# Patient Record
Sex: Male | Born: 1960 | Race: White | Hispanic: No | Marital: Married | Smoking: Current every day smoker
Health system: Southern US, Community
[De-identification: ages and names within clinical notes are randomized; demographics above are authoritative.]

## PROBLEM LIST (undated history)

## (undated) DIAGNOSIS — I10 Essential (primary) hypertension: Secondary | ICD-10-CM

## (undated) DIAGNOSIS — M4800 Spinal stenosis, site unspecified: Secondary | ICD-10-CM

## (undated) DIAGNOSIS — M25561 Pain in right knee: Secondary | ICD-10-CM

## (undated) DIAGNOSIS — G4733 Obstructive sleep apnea (adult) (pediatric): Secondary | ICD-10-CM

## (undated) DIAGNOSIS — B009 Herpesviral infection, unspecified: Secondary | ICD-10-CM

## (undated) DIAGNOSIS — M25562 Pain in left knee: Secondary | ICD-10-CM

## (undated) DIAGNOSIS — Z9989 Dependence on other enabling machines and devices: Secondary | ICD-10-CM

## (undated) HISTORY — PX: KNEE SURGERY: SHX244

## (undated) HISTORY — DX: Essential (primary) hypertension: I10

## (undated) HISTORY — PX: HERNIA REPAIR: SHX51

## (undated) HISTORY — DX: Pain in right knee: M25.562

## (undated) HISTORY — DX: Herpesviral infection, unspecified: B00.9

## (undated) HISTORY — PX: TONSILECTOMY, ADENOIDECTOMY, BILATERAL MYRINGOTOMY AND TUBES: SHX2538

## (undated) HISTORY — DX: Pain in left knee: M25.561

## (undated) HISTORY — PX: INGUINAL HERNIA REPAIR: SUR1180

## (undated) HISTORY — DX: Dependence on other enabling machines and devices: Z99.89

## (undated) HISTORY — DX: Obstructive sleep apnea (adult) (pediatric): G47.33

## (undated) HISTORY — DX: Spinal stenosis, site unspecified: M48.00

---

## 2011-06-29 ENCOUNTER — Emergency Department
Admit: 2011-06-29 | Discharge: 2011-06-29 | Disposition: A | Discharge status: Home / Self Care | Payer: Self-pay | Source: Emergency Department | Admitting: Emergency Medicine

## 2011-06-29 LAB — CBC AND DIFFERENTIAL
Basophils Absolute Automated: 0.01 10*3/uL (ref 0.00–0.20)
Basophils Automated: 0 % (ref 0–2)
Eosinophils Absolute Automated: 0.21 10*3/uL (ref 0.00–0.70)
Eosinophils Automated: 6 % — ABNORMAL HIGH (ref 0–5)
Hematocrit: 42.3 % (ref 42.0–52.0)
Hgb: 14.8 g/dL (ref 13.0–17.0)
Immature Granulocytes Absolute: 0 10*3/uL
Immature Granulocytes: 0 % (ref 0–1)
Lymphocytes Absolute Automated: 0.63 10*3/uL (ref 0.50–4.40)
Lymphocytes Automated: 19 % (ref 15–41)
MCH: 30.3 pg (ref 28.0–32.0)
MCHC: 35 g/dL (ref 32.0–36.0)
MCV: 86.5 fL (ref 80.0–100.0)
MPV: 10.8 fL (ref 9.4–12.3)
Monocytes Absolute Automated: 0.29 10*3/uL (ref 0.00–1.20)
Monocytes: 9 % (ref 0–11)
Neutrophils Absolute: 2.13 10*3/uL (ref 1.80–8.10)
Neutrophils: 65 % (ref 52–75)
Nucleated RBC: 0 /100 WBC
Platelets: 118 10*3/uL — ABNORMAL LOW (ref 140–400)
RBC: 4.89 10*6/uL (ref 4.70–6.00)
RDW: 13 % (ref 12–15)
WBC: 3.27 10*3/uL — ABNORMAL LOW (ref 3.50–10.80)

## 2011-06-29 LAB — URINALYSIS, REFLEX TO MICROSCOPIC EXAM IF INDICATED
Glucose, UA: NEGATIVE
Ketones UA: NEGATIVE
Leukocyte Esterase, UA: NEGATIVE
Nitrite, UA: NEGATIVE
Protein, UR: >=500 — AB
Specific Gravity UA POCT: 1.02 (ref 1.001–1.035)
Urine pH: 6 (ref 5.0–8.0)
Urobilinogen, UA: 2 mg/dL

## 2011-06-29 LAB — URINE ICTOTEST: Urine Ictotest: NEGATIVE

## 2018-08-20 DIAGNOSIS — R05 Cough: Secondary | ICD-10-CM | POA: Diagnosis not present

## 2018-08-20 DIAGNOSIS — J019 Acute sinusitis, unspecified: Secondary | ICD-10-CM | POA: Diagnosis not present

## 2018-08-20 DIAGNOSIS — R5381 Other malaise: Secondary | ICD-10-CM | POA: Diagnosis not present

## 2018-08-27 DIAGNOSIS — J40 Bronchitis, not specified as acute or chronic: Secondary | ICD-10-CM | POA: Diagnosis not present

## 2018-08-27 DIAGNOSIS — J329 Chronic sinusitis, unspecified: Secondary | ICD-10-CM | POA: Diagnosis not present

## 2018-09-15 DIAGNOSIS — M48061 Spinal stenosis, lumbar region without neurogenic claudication: Secondary | ICD-10-CM | POA: Diagnosis not present

## 2018-09-15 DIAGNOSIS — R030 Elevated blood-pressure reading, without diagnosis of hypertension: Secondary | ICD-10-CM | POA: Diagnosis not present

## 2018-09-15 DIAGNOSIS — J069 Acute upper respiratory infection, unspecified: Secondary | ICD-10-CM | POA: Diagnosis not present

## 2018-09-27 ENCOUNTER — Encounter: Payer: Self-pay | Admitting: Neurology

## 2018-09-28 ENCOUNTER — Ambulatory Visit: Payer: Commercial Managed Care - PPO | Admitting: Neurology

## 2018-09-28 ENCOUNTER — Encounter: Payer: Self-pay | Admitting: Neurology

## 2018-09-28 VITALS — BP 150/90 | HR 68 | Ht 71.0 in | Wt 274.0 lb

## 2018-09-28 DIAGNOSIS — Z9989 Dependence on other enabling machines and devices: Secondary | ICD-10-CM

## 2018-09-28 DIAGNOSIS — G4733 Obstructive sleep apnea (adult) (pediatric): Secondary | ICD-10-CM

## 2018-09-28 NOTE — Progress Notes (Signed)
Subjective:    Patient ID: Barry Holmes is a 58 y.o. male.  HPI     Star Age, MD, PhD [facility] [address], [address]O. [place], [place] [address]  Dear Dr. Brigitte Pulse,   I saw your patient, Barry Holmes, upon your kind request in my sleep clinic today for initial consultation of his prior diagnosis of obstructive sleep apnea. The patient is unaccompanied today. As you know, Barry Holmes is a 58 year old right-handed gentleman with an underlying medical history of lumbar spinal stenosis, bilateral knee pain with s/p right knee arthroscopic surgery in 2016, recent smoking cessation, elevated blood pressure values, and obesity, who was recently diagnosed with obstructive sleep apnea and placed on CPAP therapy. I reviewed your office note from 09/15/2018, which you kindly included. He had sleep study testing in [place], [place]. He had a split-night sleep study on 02/23/2018 and I reviewed the report. Total AHI was 62.9 per hour during the baseline portion of the study. O2 nadir was 87%. He did not achieve any REM sleep during the baseline part of the study. He was titrated on CPAP from 5 cm to 11 cm. I reviewed his PAP compliance data from 08/29/2018 through 09/27/2018, which is a total of 30 days, during which time he used his machine 28 days with percent used days greater than 4 hours at 93%, indicating excellent compliance with an average usage of 7 hours and 40 minutes, residual AHI at goal at 2.1 per hour, leak acceptable with the 95th percentile at 13.3 L/m, 95th percentile pressure at 11.6 cm, he is on AutoPap with a minimum pressure of 5 cm, max pressure of 15 cm, with EPR. He reports that AutoPap is going well. His DME is [facility].  His Epworth sleepiness score is 5 out of 24 today, fatigue score is 13 out of 63. He is married and lives with his wife, they have no children. He quit smoking in September 2019, drinks alcohol, up to 10 drinks per week on average,  caffeine the form of coffee, 2-3 cups per day on average.  No pets in the house. Does not watch TV in the BR. He takes OTC Advil or Tylenol for arthritis.  He reports being fully compliant with his AutoPap. He has greatly benefited from it and since starting treatment about 6 months ago he has slept better, sleep is more consolidated, less nocturia is noted, he wakes up better rested and has less daytime somnolence. He denies any restless leg symptoms. Bedtime is generally around 9:30 and he likes to read until about 10. Rise time is around 5 or 5:30. He is not aware of any family history of OSA. He had a tonsillectomy as a child. He has gained weight in the past few months, especially since the move from [place] about 4 months ago. He is hoping to buy a home soon, they were recently able to close on their home in [place]. He works for [employer].  His Past Medical History Is Significant For: Past Medical History:  Diagnosis Date  . Bilateral knee pain   . HSV-1 (herpes simplex virus 1) infection   . OSA on CPAP   . Spinal stenosis      His Past Surgical History Is Significant For:   His Family History Is Significant For: Family History  Problem Relation Age of Onset  . Arthritis Mother   . Hypertension Mother   . Hyperlipidemia Mother   . CAD Father  His Social History Is Significant For: Social History   Socioeconomic History  . Marital status: Married    Spouse name: Not on file  . Number of children: Not on file  . Years of education: Not on file  . Highest education level: Not on file  Occupational History  . Not on file  Social Needs  . Financial resource strain: Not on file  . Food insecurity:    Worry: Not on file    Inability: Not on file  . Transportation needs:    Medical: Not on file    Non-medical: Not on file  Tobacco Use  . Smoking status: Former Research scientist (life sciences)  . Smokeless tobacco: Never Used  Substance and Sexual Activity  . Alcohol use: Yes     Alcohol/week: 10.0 standard drinks    Types: 10 Standard drinks or equivalent per week  . Drug use: Not on file  . Sexual activity: Not on file  Lifestyle  . Physical activity:    Days per week: Not on file    Minutes per session: Not on file  . Stress: Not on file  Relationships  . Social connections:    Talks on phone: Not on file    Gets together: Not on file    Attends religious service: Not on file    Active member of club or organization: Not on file    Attends meetings of clubs or organizations: Not on file    Relationship status: Not on file  Other Topics Concern  . Not on file  Social History Narrative  . Not on file    His Allergies Are:  Allergies  Allergen Reactions  . Shellfish Allergy   :   His Current Medications Are:  Outpatient Encounter Medications as of 09/28/2018  Medication Sig  . acyclovir ointment (ZOVIRAX) 5 % Apply 1 application topically.  Marland Kitchen buPROPion (WELLBUTRIN SR) 150 MG 12 hr tablet Take 150 mg by mouth daily.   Marland Kitchen erythromycin ophthalmic ointment 1 application.  Marland Kitchen Lifitegrast (XIIDRA) 5 % SOLN Apply to eye.  . valACYclovir (VALTREX) 1000 MG tablet Take 1,000 mg by mouth.   No facility-administered encounter medications on file as of 09/28/2018.   :  Review of Systems:  Out of a complete 14 point review of systems, all are reviewed and negative with the exception of these symptoms as listed below: Review of Systems  Neurological:       Pt presents today to discuss his cpap. His insurance has changed since he started cpap less than a year ago. He is wondering what he needs to do. Pt has been using [facility].  Epworth Sleepiness Scale 0= would never doze 1= slight chance of dozing 2= moderate chance of dozing 3= high chance of dozing  Sitting and reading: 0 Watching TV: 1 Sitting inactive in a public place (ex. Theater or meeting): 0 As a passenger in a car for an hour without a break: 2 Lying down to rest in the afternoon: 1 Sitting  and talking to someone: 0 Sitting quietly after lunch (no alcohol): 1 In a car, while stopped in traffic: 0 Total: 5     Objective:  Neurological Exam  Physical Exam Physical Examination:   Vitals:   09/28/18 1120  BP: (!) 150/90  Pulse: 68   General Examination: The patient is a very pleasant 58 y.o. male in no acute distress. He appears well-developed and well-nourished and well groomed.   HEENT: Normocephalic, atraumatic, pupils are equal, round and  reactive to light and accommodation. Extraocular tracking is good without limitation to gaze excursion or nystagmus noted. Normal smooth pursuit is noted. Hearing is grossly intact. Tympanic membranes are clear bilaterally. Face is symmetric with normal facial animation and normal facial sensation. Speech is clear with no dysarthria noted. There is no hypophonia. There is no lip, neck/head, jaw or voice tremor. Neck is supple with full range of passive and active motion. There are no carotid bruits on auscultation. Oropharynx exam reveals: mild mouth dryness, adequate dental hygiene and moderate airway crowding, due to thicker soft palate and larger uvula, Mallampati is class III, he is status post tonsillectomy. Neck circumference is 19-1/2 inches. Tongue protrudes centrally and palate elevates symmetrically.   Chest: Clear to auscultation without wheezing, rhonchi or crackles noted.  Heart: S1+S2+0, regular and normal without murmurs, rubs or gallops noted.   Abdomen: Soft, non-tender and non-distended with normal bowel sounds appreciated on auscultation.  Extremities: There is no pitting edema in the distal lower extremities bilaterally. Pedal pulses are intact.  Skin: Warm and dry without trophic changes noted.  Musculoskeletal: exam reveals no obvious joint deformities, tenderness or joint swelling or erythema, except b/l knee discomfort.   Neurologically:  Mental status: The patient is awake, alert and oriented in all 4  spheres. His immediate and remote memory, attention, language skills and fund of knowledge are appropriate. There is no evidence of aphasia, agnosia, apraxia or anomia. Speech is clear with normal prosody and enunciation. Thought process is linear. Mood is normal and affect is normal.  Cranial nerves II - XII are as described above under HEENT exam. In addition: shoulder shrug is normal with equal shoulder height noted. Motor exam: Normal bulk, strength and tone is noted. There is no drift, tremor or rebound. Romberg is negative. Fine motor skills and coordination: grossly intact.  Cerebellar testing: No dysmetria or intention tremor.  Sensory exam: intact to light touch in the upper and lower extremities.  Gait, station and balance: He stands easily. No veering to one side is noted. No leaning to one side is noted. Posture is age-appropriate and stance is narrow based. Gait shows normal stride length and normal pace. No problems turning are noted. Tandem walk is difficult for him secondary to knee pain bilaterally.             Assessment and Plan:  In summary, Barry Holmes is a very pleasant 58 y.o.-year old male with an underlying medical history of lumbar spinal stenosis, bilateral knee pain with s/p right knee arthroscopic surgery in 2016, recent smoking cessation, elevated blood pressure values, and obesity, who presents for evaluation of his obstructive sleep apnea, to establish care after moving from [place]. Split-night sleep study in July or August 2019. He was diagnosed with severe obstructive sleep apnea. He has been on AutoPap therapy with excellent compliance. He has noticed improvement with regards to daytime somnolence, sleep quality, sleep consolidation and nocturia. He is commended for his treatment adherence. He has been using a nasal mask. He is encouraged to call the local office for [facility] and was provided the phone number and address for them. Since he is doing well he can follow-up  routinely in one year, sooner if needed. I answered all his questions today and the patient was in agreement.  Thank you very much for allowing me to participate in the care of this nice patient. If I can be of any further assistance to you please do not hesitate to call me at  [phone].  Sincerely,   Star Age, MD, PhD

## 2018-09-28 NOTE — Patient Instructions (Addendum)
Please continue using your CPAP regularly. While your insurance requires that you use CPAP at least 4 hours each night on 70% of the nights, I recommend, that you not skip any nights and use it throughout the night if you can. Getting used to CPAP and staying with the treatment long term does take time and patience and discipline. Untreated obstructive sleep apnea when it is moderate to severe can have an adverse impact on cardiovascular health and raise her risk for heart disease, arrhythmias, hypertension, congestive heart failure, stroke and diabetes. Untreated obstructive sleep apnea causes sleep disruption, nonrestorative sleep, and sleep deprivation. This can have an impact on your day to day functioning and cause daytime sleepiness and impairment of cognitive function, memory loss, mood disturbance, and problems focussing. Using CPAP regularly can improve these symptoms.  Keep up the good work! I will see you back in one year! 

## 2018-10-15 DIAGNOSIS — M25561 Pain in right knee: Secondary | ICD-10-CM | POA: Diagnosis not present

## 2018-10-15 DIAGNOSIS — M25562 Pain in left knee: Secondary | ICD-10-CM | POA: Diagnosis not present

## 2018-10-26 DIAGNOSIS — M47816 Spondylosis without myelopathy or radiculopathy, lumbar region: Secondary | ICD-10-CM | POA: Diagnosis not present

## 2018-10-26 DIAGNOSIS — M546 Pain in thoracic spine: Secondary | ICD-10-CM | POA: Diagnosis not present

## 2018-10-26 DIAGNOSIS — M5136 Other intervertebral disc degeneration, lumbar region: Secondary | ICD-10-CM | POA: Diagnosis not present

## 2018-10-26 DIAGNOSIS — M5126 Other intervertebral disc displacement, lumbar region: Secondary | ICD-10-CM | POA: Diagnosis not present

## 2018-10-26 DIAGNOSIS — M549 Dorsalgia, unspecified: Secondary | ICD-10-CM | POA: Diagnosis not present

## 2018-11-11 ENCOUNTER — Other Ambulatory Visit: Payer: Self-pay | Admitting: Neurosurgery

## 2018-11-11 DIAGNOSIS — M5416 Radiculopathy, lumbar region: Secondary | ICD-10-CM

## 2019-01-03 ENCOUNTER — Ambulatory Visit
Admission: RE | Admit: 2019-01-03 | Discharge: 2019-01-03 | Disposition: A | Discharge status: Home / Self Care | Payer: Commercial Managed Care - PPO | Source: Ambulatory Visit | Attending: Neurosurgery | Admitting: Neurosurgery

## 2019-01-03 ENCOUNTER — Other Ambulatory Visit: Payer: Self-pay

## 2019-01-03 DIAGNOSIS — M5416 Radiculopathy, lumbar region: Secondary | ICD-10-CM

## 2019-03-11 ENCOUNTER — Other Ambulatory Visit: Payer: Self-pay | Admitting: Internal Medicine

## 2019-03-11 DIAGNOSIS — Z8249 Family history of ischemic heart disease and other diseases of the circulatory system: Secondary | ICD-10-CM

## 2019-03-24 ENCOUNTER — Ambulatory Visit
Admission: RE | Admit: 2019-03-24 | Discharge: 2019-03-24 | Disposition: A | Discharge status: Home / Self Care | Payer: Commercial Managed Care - PPO | Source: Ambulatory Visit | Attending: Internal Medicine | Admitting: Internal Medicine

## 2019-03-24 DIAGNOSIS — Z8249 Family history of ischemic heart disease and other diseases of the circulatory system: Secondary | ICD-10-CM

## 2019-12-08 ENCOUNTER — Encounter: Payer: Self-pay | Admitting: Neurology

## 2019-12-08 ENCOUNTER — Other Ambulatory Visit: Payer: Self-pay

## 2019-12-08 ENCOUNTER — Ambulatory Visit: Payer: Commercial Managed Care - PPO | Admitting: Neurology

## 2019-12-08 VITALS — BP 126/86 | HR 80 | Temp 97.2°F | Ht 71.75 in | Wt 275.0 lb

## 2019-12-08 DIAGNOSIS — Z9989 Dependence on other enabling machines and devices: Secondary | ICD-10-CM

## 2019-12-08 DIAGNOSIS — H04129 Dry eye syndrome of unspecified lacrimal gland: Secondary | ICD-10-CM

## 2019-12-08 DIAGNOSIS — G4733 Obstructive sleep apnea (adult) (pediatric): Secondary | ICD-10-CM

## 2019-12-08 DIAGNOSIS — Z789 Other specified health status: Secondary | ICD-10-CM | POA: Diagnosis not present

## 2019-12-08 DIAGNOSIS — G4719 Other hypersomnia: Secondary | ICD-10-CM

## 2019-12-08 NOTE — Progress Notes (Signed)
Subjective:    Patient ID: Barry Holmes is a 59 y.o. male.  HPI     Interim history:  Barry Holmes is a 59 year old right-handed gentleman with an underlying medical history of lumbar spinal stenosis, bilateral knee pain with s/p right knee arthroscopic surgery in 2016, recent smoking cessation, elevated blood pressure values, and obesity, who presents for follow-up consultation of his obstructive sleep apnea, for his yearly checkup.  The patient is unaccompanied today.  I first met him at the request of his primary care physician on 09/28/2018, at which time the patient was on AutoPap therapy.  He had been diagnosed with severe obstructive sleep apnea with a sleep study in [place], [place].  He was compliant with treatment.  He was advised to follow-up routinely in 1 year.  He was advised to transfer his DME to a local office with [facility].   Today, 12/08/2019: I reviewed his AutoPap compliance data from 11/08/2019 through 12/07/2019, which is a total of 30 days, during which time he used his machine every night with percent use days greater than 4 hours at 100%, indicating superb compliance with an average usage of 7 hours and 25 minutes, residual AHI at goal at 1.3/h, leak acceptable but on the higher end with a 95th percentile at 20.2 L/min, average pressure for the 95th percentile at 11.3 L/min, pressure range of 5 to 15 cm with EPR of 2.  He reports that he is fully compliant with treatment but sometimes he feels that his sleep apnea symptoms are returning.  He has had more trouble staying asleep, he has woken up with dry eyes and eyelids flipped which was one of his initial symptoms of having sleep apnea and he has more dry mouth.  He has not really changed anything in the settings, he has had some trouble getting his supplies as he did not have his exact yearly checkup but this was because of the pandemic.  He has had his Covid vaccine.  He is motivated to continue with treatment but is worried that his  treatment settings may need to be adjusted.  He has not established with an ophthalmologist since he moved from [place].  He has prescription eyeglasses and has had issues with dry eyes.  The patient's allergies, current medications, family history, past medical history, past social history, past surgical history and problem list were reviewed and updated as appropriate.    Previously:   09/28/18: (He) was recently diagnosed with obstructive sleep apnea and placed on CPAP therapy. I reviewed your office note from 09/15/2018, which you kindly included. He had sleep study testing in [place], [place]. He had a split-night sleep study on 02/23/2018 and I reviewed the report. Total AHI was 62.9 per hour during the baseline portion of the study. O2 nadir was 87%. He did not achieve any REM sleep during the baseline part of the study. He was titrated on CPAP from 5 cm to 11 cm. I reviewed his PAP compliance data from 08/29/2018 through 09/27/2018, which is a total of 30 days, during which time he used his machine 28 days with percent used days greater than 4 hours at 93%, indicating excellent compliance with an average usage of 7 hours and 40 minutes, residual AHI at goal at 2.1 per hour, leak acceptable with the 95th percentile at 13.3 L/m, 95th percentile pressure at 11.6 cm, he is on AutoPap with a minimum pressure of 5 cm, max pressure of 15 cm, with EPR. He reports that AutoPap is  going well. His DME is [facility].  His Epworth sleepiness score is 5 out of 24 today, fatigue score is 13 out of 63. He is married and lives with his wife, they have no children. He quit smoking in September 2019, drinks alcohol, up to 10 drinks per week on average, caffeine the form of coffee, 2-3 cups per day on average.  No pets in the house. Does not watch TV in the BR. He takes OTC Advil or Tylenol for arthritis.  He reports being fully compliant with his AutoPap. He has greatly benefited from it and since starting treatment  about 6 months ago he has slept better, sleep is more consolidated, less nocturia is noted, he wakes up better rested and has less daytime somnolence. He denies any restless leg symptoms. Bedtime is generally around 9:30 and he likes to read until about 10. Rise time is around 5 or 5:30. He is not aware of any family history of OSA. He had a tonsillectomy as a child. He has gained weight in the past few months, especially since the move from [place] about 4 months ago. He is hoping to buy a home soon, they were recently able to close on their home in [place]. He works for [employer].   His Past Medical History Is Significant For: Past Medical History:  Diagnosis Date  . Bilateral knee pain   . HSV-1 (herpes simplex virus 1) infection   . OSA on CPAP   . Spinal stenosis     His Past Surgical History Is Significant For:   His Family History Is Significant For: Family History  Problem Relation Age of Onset  . Arthritis Mother   . Hypertension Mother   . Hyperlipidemia Mother   . CAD Father     His Social History Is Significant For: Social History   Socioeconomic History  . Marital status: Married    Spouse name: Not on file  . Number of children: Not on file  . Years of education: Not on file  . Highest education level: Not on file  Occupational History  . Not on file  Tobacco Use  . Smoking status: Former Research scientist (life sciences)  . Smokeless tobacco: Never Used  Substance and Sexual Activity  . Alcohol use: Yes    Alcohol/week: 10.0 standard drinks    Types: 10 Standard drinks or equivalent per week  . Drug use: Not on file  . Sexual activity: Not on file  Other Topics Concern  . Not on file  Social History Narrative  . Not on file   Social Determinants of Health   Financial Resource Strain:   . Difficulty of Paying Living Expenses:   Food Insecurity:   . Worried About Charity fundraiser in the Last Year:   . Arboriculturist in the Last Year:   Transportation Needs:   . Consulting civil engineer (Medical):   Marland Kitchen Lack of Transportation (Non-Medical):   Physical Activity:   . Days of Exercise per Week:   . Minutes of Exercise per Session:   Stress:   . Feeling of Stress :   Social Connections:   . Frequency of Communication with Friends and Family:   . Frequency of Social Gatherings with Friends and Family:   . Attends Religious Services:   . Active Member of Clubs or Organizations:   . Attends Archivist Meetings:   Marland Kitchen Marital Status:     His Allergies Are:  Allergies  Allergen Reactions  .  Shellfish Allergy   :   His Current Medications Are:  Outpatient Encounter Medications as of 12/08/2019  Medication Sig  . acyclovir ointment (ZOVIRAX) 5 % Apply 1 application topically.  . valACYclovir (VALTREX) 1000 MG tablet Take 1,000 mg by mouth.  . [DISCONTINUED] buPROPion (WELLBUTRIN SR) 150 MG 12 hr tablet Take 150 mg by mouth daily.   . [DISCONTINUED] erythromycin ophthalmic ointment 1 application.  . [DISCONTINUED] Lifitegrast (XIIDRA) 5 % SOLN Apply to eye.   No facility-administered encounter medications on file as of 12/08/2019.  :  Review of Systems:  Out of a complete 14 point review of systems, all are reviewed and negative with the exception of these symptoms as listed below: Review of Systems  Neurological:       Here for f/u on cpap. Reports he has been having trouble sleeping at night and doesn't feel like the machine is working as well for him. Reports his supplier has been giving him trouble too ( not sending supplies, over charging, ect. )    Objective:  Neurological Exam  Physical Exam Physical Examination:   Vitals:   12/08/19 1141  BP: 126/86  Pulse: 80  Temp: (!) 97.2 F (36.2 C)    General Examination: The patient is a very pleasant 59 y.o. male in no acute distress. He appears well-developed and well-nourished and well groomed.   HEENT: Normocephalic, atraumatic, pupils are equal, round and reactive to light.  Extraocular tracking is good without limitation to gaze excursion or nystagmus noted. Normal smooth pursuit is noted. Hearing is grossly intact. Face is symmetric with normal facial animation. Speech is clear with no dysarthria noted. There is no hypophonia. There is no lip, neck/head, jaw or voice tremor. Neck is supple with full range of passive and active motion. There are no carotid bruits on auscultation. Oropharynx exam reveals: mild mouth dryness, adequate dental hygiene and moderate airway crowding. Tongue protrudes centrally and palate elevates symmetrically.   Chest: Clear to auscultation without wheezing, rhonchi or crackles noted.  Heart: S1+S2+0, regular and normal without murmurs, rubs or gallops noted.   Abdomen: Soft, non-tender and non-distended.  Extremities: There is no pitting edema in the distal lower extremities bilaterally.  Skin: Warm and dry without trophic changes noted.  Musculoskeletal: exam reveals no obvious joint deformities, tenderness or joint swelling or erythema, except b/l knee discomfort.   Neurologically:  Mental status: The patient is awake, alert and oriented in all 4 spheres. His immediate and remote memory, attention, language skills and fund of knowledge are appropriate. There is no evidence of aphasia, agnosia, apraxia or anomia. Speech is clear with normal prosody and enunciation. Thought process is linear. Mood is normal and affect is normal.  Cranial nerves II - XII are as described above under HEENT exam. In addition: shoulder shrug is normal with equal shoulder height noted. Motor exam: Normal bulk, strength and tone is noted. There is no tremor. Fine motor skills and coordination: grossly intact.  Cerebellar testing: No dysmetria or intention tremor.  Sensory exam: intact to light touch in the upper and lower extremities.  Gait, station and balance: He stands easily. No veering to one side is noted. No leaning to one side is noted. Posture  is age-appropriate and stance is narrow based. Gait shows normal stride length and normal pace. No problems turning are noted.        Assessment and Plan:  In summary, Barry Holmes is a very pleasant 59 year old male with an underlying medical  history of lumbar spinal stenosis, bilateral knee pain with s/p right knee arthroscopic surgery in 2016, recent smoking cessation, elevated blood pressure values, and obesity, who presents for follow-up consultation of his obstructive sleep apnea.  He has been on AutoPap therapy since September 2019.  He is fully compliant with treatment.  He had initially noticed significant improvement in his sleep apnea symptoms but lately, he has had more trouble staying asleep and also feels that his dry eye syndrome is returning which was one of the initial symptoms that led him to get evaluated for sleep apnea.  He was diagnosed with severe obstructive sleep apnea in [place] in July or August 2019 and had a split-night sleep study.  He had some trouble getting his supplies in a timely manner, needs new supplies now.  I suggested we proceed with an overnight pulse oximetry test to make sure that his oxygen saturations are adequate.  His sleep apnea score is at goal while he is on AutoPap therapy at the current settings of 5-15.  He is fully compliant with treatment and highly commended for this.  I also will write for new supplies through his DME company.  He is advised to call us if he does not hear back about getting new supplies within the next week or getting his oxygen monitor for testing at home.  He will use his AutoPap as usual and also put the pulse oximeter on for 1 night.  We will call him with the oximetry test result.  If possible, we may want to bring him in for an overnight titration study to optimize his treatment settings.  Furthermore, he is advised to establish care with an ophthalmologist here locally as he has prescription eyeglasses and has had dry eyes with  symptoms.  He was agreeable.  He is advised to make a follow-up appointment routinely in 1 year but we may have to move up his appointment according to the next evaluation, we will call with the ONO and see about bringing him in for a titration study next.  I answered all his questions today and he was in agreement.  I spent 30 minutes in total face-to-face time and in reviewing records during pre-charting, more than 50% of which was spent in counseling and coordination of care, reviewing test results, reviewing medications and treatment regimen and/or in discussing or reviewing the diagnosis of OSA, the prognosis and treatment options. Pertinent laboratory and imaging test results that were available during this visit with the patient were reviewed by me and considered in my medical decision making (see chart for details).

## 2019-12-08 NOTE — Patient Instructions (Signed)
You are fully compliant with your AutoPap machine, keep up the good work.  Nevertheless, since you are having some symptoms of your original sleep apnea symptoms including dry eyes and not sleeping well through the night, I would like for [place] to look at your oxygen level at night with a pulse oximeter.  I have placed an order for [facility] to provide you with a sensor for a overnight test 1 of these nights, you will use your AutoPap as usual and put the finger sensor on for monitoring your oxygen level overnight.  We will call you with the results.  If possible, I would like to then consider bringing you in for a proper CPAP titration study.  I have also placed an order for [facility] to send you all new supplies.  Please let us know within the next week or 10 days if you have not heard about your supplies to be renewed your your oxygen test at home.  Follow-up routinely in 1 year, we will adjust your follow-up appointment depending on if we proceed with a sleep study next.

## 2019-12-08 NOTE — Progress Notes (Signed)
New order for cpap has been sent to [facility].

## 2019-12-21 ENCOUNTER — Encounter: Payer: Self-pay | Admitting: Neurology

## 2020-01-03 ENCOUNTER — Telehealth: Payer: Self-pay | Admitting: Neurology

## 2020-01-03 NOTE — Telephone Encounter (Signed)
I called pt. No answer, left a message asking pt to call me back.   

## 2020-01-03 NOTE — Telephone Encounter (Signed)
I received patient's pulse oximetry test results from 12/21/2019. Total test time was 7 hours and 59 minutes, test was started with AutoPap and placed on room air. Average oxygen saturation 93.3%, lowest saturation 85%, time below are at 88% saturation was 1.4 minutes.  Please call patient and advise him that his oxygen saturation test showed a few mild desaturations while he was on his AutoPap therapy. All in all, his sleep apnea is well treated and we will stay course. He does not require supplemental oxygen with his AutoPap. I would like to remind him to be fully compliant with his AutoPap and continue to work on weight loss which will likely help reduce the severity of his sleep apnea. He can follow-up as scheduled.

## 2020-01-04 NOTE — Telephone Encounter (Signed)
I contacted the pt and advised of result. Pt verbalized understanding and had no further questions/results at this time.

## 2020-03-19 ENCOUNTER — Other Ambulatory Visit: Payer: Self-pay | Admitting: Internal Medicine

## 2020-03-19 DIAGNOSIS — F17219 Nicotine dependence, cigarettes, with unspecified nicotine-induced disorders: Secondary | ICD-10-CM

## 2020-03-30 ENCOUNTER — Ambulatory Visit
Admission: RE | Admit: 2020-03-30 | Discharge: 2020-03-30 | Disposition: A | Discharge status: Home / Self Care | Payer: Commercial Managed Care - PPO | Source: Ambulatory Visit | Attending: Internal Medicine | Admitting: Internal Medicine

## 2020-03-30 DIAGNOSIS — F17219 Nicotine dependence, cigarettes, with unspecified nicotine-induced disorders: Secondary | ICD-10-CM

## 2020-04-06 ENCOUNTER — Other Ambulatory Visit: Payer: Self-pay | Admitting: Internal Medicine

## 2020-04-06 DIAGNOSIS — E041 Nontoxic single thyroid nodule: Secondary | ICD-10-CM

## 2020-04-13 ENCOUNTER — Ambulatory Visit
Admission: RE | Admit: 2020-04-13 | Discharge: 2020-04-13 | Disposition: A | Discharge status: Home / Self Care | Payer: Commercial Managed Care - PPO | Source: Ambulatory Visit | Attending: Internal Medicine | Admitting: Internal Medicine

## 2020-04-13 DIAGNOSIS — E041 Nontoxic single thyroid nodule: Secondary | ICD-10-CM

## 2020-04-23 ENCOUNTER — Other Ambulatory Visit: Payer: Self-pay | Admitting: Internal Medicine

## 2020-04-23 DIAGNOSIS — E041 Nontoxic single thyroid nodule: Secondary | ICD-10-CM

## 2020-05-08 ENCOUNTER — Other Ambulatory Visit (HOSPITAL_COMMUNITY)
Admission: RE | Admit: 2020-05-08 | Discharge: 2020-05-08 | Disposition: A | Discharge status: Home / Self Care | Payer: Commercial Managed Care - PPO | Source: Ambulatory Visit | Attending: Radiology | Admitting: Radiology

## 2020-05-08 ENCOUNTER — Ambulatory Visit
Admission: RE | Admit: 2020-05-08 | Discharge: 2020-05-08 | Disposition: A | Discharge status: Home / Self Care | Payer: Commercial Managed Care - PPO | Source: Ambulatory Visit | Attending: Internal Medicine | Admitting: Internal Medicine

## 2020-05-08 DIAGNOSIS — E041 Nontoxic single thyroid nodule: Secondary | ICD-10-CM | POA: Diagnosis present

## 2020-05-08 DIAGNOSIS — D34 Benign neoplasm of thyroid gland: Secondary | ICD-10-CM | POA: Diagnosis not present

## 2020-05-09 LAB — CYTOLOGY - NON PAP

## 2020-05-11 ENCOUNTER — Emergency Department: Payer: 59

## 2020-05-11 ENCOUNTER — Emergency Department
Admission: EM | Admit: 2020-05-11 | Discharge: 2020-05-11 | Disposition: A | Discharge status: Home / Self Care | Payer: 59 | Attending: Emergency Medicine | Admitting: Emergency Medicine

## 2020-05-11 DIAGNOSIS — M1611 Unilateral primary osteoarthritis, right hip: Secondary | ICD-10-CM | POA: Insufficient documentation

## 2020-05-11 DIAGNOSIS — F1010 Alcohol abuse, uncomplicated: Secondary | ICD-10-CM | POA: Insufficient documentation

## 2020-05-11 DIAGNOSIS — S060X0A Concussion without loss of consciousness, initial encounter: Secondary | ICD-10-CM | POA: Insufficient documentation

## 2020-05-11 DIAGNOSIS — S7001XA Contusion of right hip, initial encounter: Secondary | ICD-10-CM | POA: Insufficient documentation

## 2020-05-11 DIAGNOSIS — I10 Essential (primary) hypertension: Secondary | ICD-10-CM | POA: Insufficient documentation

## 2020-05-11 DIAGNOSIS — F17210 Nicotine dependence, cigarettes, uncomplicated: Secondary | ICD-10-CM | POA: Insufficient documentation

## 2020-05-11 LAB — CBC AND DIFFERENTIAL
Absolute NRBC: 0 10*3/uL (ref 0.00–0.00)
Basophils Absolute Automated: 0.08 10*3/uL (ref 0.00–0.08)
Basophils Automated: 0.8 %
Eosinophils Absolute Automated: 0.26 10*3/uL (ref 0.00–0.44)
Eosinophils Automated: 2.6 %
Hematocrit: 44.2 % (ref 37.6–49.6)
Hgb: 14.7 g/dL (ref 12.5–17.1)
Immature Granulocytes Absolute: 0.04 10*3/uL (ref 0.00–0.07)
Immature Granulocytes: 0.4 %
Lymphocytes Absolute Automated: 2.88 10*3/uL (ref 0.42–3.22)
Lymphocytes Automated: 28.6 %
MCH: 30 pg (ref 25.1–33.5)
MCHC: 33.3 g/dL (ref 31.5–35.8)
MCV: 90.2 fL (ref 78.0–96.0)
MPV: 9.9 fL (ref 8.9–12.5)
Monocytes Absolute Automated: 0.67 10*3/uL (ref 0.21–0.85)
Monocytes: 6.6 %
Neutrophils Absolute: 6.15 10*3/uL (ref 1.10–6.33)
Neutrophils: 61 %
Nucleated RBC: 0 /100 WBC (ref 0.0–0.0)
Platelets: 221 10*3/uL (ref 142–346)
RBC: 4.9 10*6/uL (ref 4.20–5.90)
RDW: 13 % (ref 11–15)
WBC: 10.08 10*3/uL — ABNORMAL HIGH (ref 3.10–9.50)

## 2020-05-11 LAB — URINALYSIS REFLEX TO MICROSCOPIC EXAM - REFLEX TO CULTURE
Bilirubin, UA: NEGATIVE
Blood, UA: NEGATIVE
Glucose, UA: NEGATIVE
Ketones UA: NEGATIVE
Leukocyte Esterase, UA: NEGATIVE
Nitrite, UA: NEGATIVE
Protein, UR: NEGATIVE
Specific Gravity UA: 1.005 (ref 1.001–1.035)
Urine pH: 6 (ref 5.0–8.0)
Urobilinogen, UA: NEGATIVE mg/dL (ref 0.2–2.0)

## 2020-05-11 LAB — COMPREHENSIVE METABOLIC PANEL
ALT: 34 U/L (ref 0–55)
AST (SGOT): 29 U/L (ref 5–34)
Albumin/Globulin Ratio: 1.5 (ref 0.9–2.2)
Albumin: 4.3 g/dL (ref 3.5–5.0)
Alkaline Phosphatase: 59 U/L (ref 38–106)
Anion Gap: 11 (ref 5.0–15.0)
BUN: 14 mg/dL (ref 9–28)
Bilirubin, Total: 0.4 mg/dL (ref 0.2–1.2)
CO2: 25 mEq/L (ref 22–29)
Calcium: 9.6 mg/dL (ref 8.5–10.5)
Chloride: 106 mEq/L (ref 100–111)
Creatinine: 1.3 mg/dL (ref 0.7–1.3)
Globulin: 2.8 g/dL (ref 2.0–3.6)
Glucose: 86 mg/dL (ref 70–100)
Potassium: 5.1 mEq/L (ref 3.5–5.1)
Protein, Total: 7.1 g/dL (ref 6.0–8.3)
Sodium: 142 mEq/L (ref 136–145)

## 2020-05-11 LAB — RAPID DRUG SCREEN, URINE
Barbiturate Screen, UR: NEGATIVE
Benzodiazepine Screen, UR: NEGATIVE
Cannabinoid Screen, UR: NEGATIVE
Cocaine, UR: NEGATIVE
Opiate Screen, UR: NEGATIVE
PCP Screen, UR: NEGATIVE
Urine Amphetamine Screen: NEGATIVE

## 2020-05-11 LAB — ETHANOL: Alcohol: 160 mg/dL — ABNORMAL HIGH

## 2020-05-11 LAB — GFR: EGFR: 56.5

## 2020-05-11 NOTE — ED Notes (Addendum)
Both pt and spouse at bedside aware of pending CT results and lab work. Pt is observed in no obvious distress. Call light within reach for use.

## 2020-05-11 NOTE — ED Provider Notes (Signed)
History     Chief Complaint   Patient presents with    Head Injury     The history is provided by the patient and medical records. No language interpreter was used.   Trauma  Mechanism of injury: PT on back of golf cart which tipped and he fell hitting hard ground.  He continued to play but was dizzy so wife brought him to ER  Injury location: leg  Injury location detail: R hip  Incident location: outdoors  Arrived directly from scene: no     EMS/PTA data:       Loss of consciousness: no       Amnesic to event: somewhat, details cloudy.    Current symptoms:       Pain quality: dull       Pain timing: constant       Associated symptoms:             Denies abdominal pain, chest pain, difficulty breathing, loss of consciousness, neck pain, seizures and vomiting.             Minimal headache    Relevant PMH:       Medical risk factors:             No CAD.      He does not think he was knocked out but lay on ground and could not get up for 5 minutes  He admits to drinking beer during round but no more than normal for him  Past Medical History:   Diagnosis Date    Hypertension        Past Surgical History:   Procedure Laterality Date    HERNIA REPAIR      KNEE SURGERY         History reviewed. No pertinent family history.    Social  Social History     Tobacco Use    Smoking status: Current Every Day Smoker     Packs/day: 0.50     Types: Cigarettes    Smokeless tobacco: Never Used   Substance Use Topics    Alcohol use: Yes     Comment: social     Drug use: Never       .     Allergies   Allergen Reactions    Penicillins     Shellfish-Derived Products        Home Medications     None on File           Review of Systems   Constitutional: Negative for chills and fever.   HENT: Negative for congestion.    Respiratory: Negative for cough and shortness of breath.    Cardiovascular: Negative for chest pain.   Gastrointestinal: Negative for abdominal pain and vomiting.   Musculoskeletal: Negative for neck pain.    Neurological: Negative for dizziness, seizures, loss of consciousness, facial asymmetry and speech difficulty.   All other systems reviewed and are negative.      Physical Exam    BP: (!) 141/92, Heart Rate: 78, Temp: 98.7 F (37.1 C), Resp Rate: 18, SpO2: 97 %, Weight: 122.5 kg    Physical Exam  Nursing note and vitals reviewed.  Constitutional:  Well developed, well nourished. Awake & Oriented x3.  Head:  Atraumatic. Normocephalic.    Eyes:  PERRL. EOMI. Conjunctivae are not pale.  ENT:  Mucous membranes are moist and intact. Oropharynx is clear and symmetric.  Patent airway.  Neck:  Supple. Full ROM.  No pain  Cardiovascular:  Regular rate. Regular rhythm. No murmurs, rubs, or gallops.  Pulmonary/Chest:  No evidence of respiratory distress. Clear to auscultation bilaterally.  No wheezing, rales or rhonchi.   Abdominal:  Soft and non-distended. There is no tenderness. No rebound, guarding, or rigidity.  Back:  Full ROM. Nontender.  Extremities:  No edema. No cyanosis. No clubbing. Full range of motion in all extremities.  Tender to palpation right hip.  Skin:  Skin is warm and dry.  No diaphoresis. No rash.   Neurological:  Alert, awake, and appropriate. Mild slurring of speech. Motor normal.  Psychiatric:  Good eye contact. Normal interaction, affect, and behavior.    Pox 97 ra normal no tx needed    MDM and ED Course     ED Medication Orders (From admission, onward)    None         Results for orders placed or performed during the hospital encounter of 05/11/20   CBC and differential   Result Value Ref Range    WBC 10.08 (H) 3.1 - 9.5 x10 3/uL    Hgb 14.7 12.5 - 17.1 g/dL    Hematocrit 16.1 09.6 - 49.6 %    Platelets 221 142 - 346 x10 3/uL    RBC 4.90 4.20 - 5.90 x10 6/uL    MCV 90.2 78.0 - 96.0 fL    MCH 30.0 25.1 - 33.5 pg    MCHC 33.3 31 - 35 g/dL    RDW 13 04.5 - 40.9 %    MPV 9.9 8.9 - 12.5 fL    Neutrophils 61.0 None %    Lymphocytes Automated 28.6 None %    Monocytes 6.6 None %    Eosinophils Automated  2.6 None %    Basophils Automated 0.8 None %    Immature Granulocytes 0.4 None %    Nucleated RBC 0.0 0.0 - 0.0 /100 WBC    Neutrophils Absolute 6.15 1 - 6 x10 3/uL    Lymphocytes Absolute Automated 2.88 0.42 - 3.22 x10 3/uL    Monocytes Absolute Automated 0.67 0.21 - 0.85 x10 3/uL    Eosinophils Absolute Automated 0.26 0.00 - 0.44 x10 3/uL    Basophils Absolute Automated 0.08 0.00 - 0.08 x10 3/uL    Immature Granulocytes Absolute 0.04 0.00 - 0.07 x10 3/uL    Absolute NRBC 0.00 0.00 - 0.00 x10 3/uL   Comprehensive metabolic panel   Result Value Ref Range    Glucose 86 70 - 100 mg/dL    BUN 14 9 - 28 mg/dL    Creatinine 1.3 0.7 - 1.3 mg/dL    Sodium 811 914 - 782 mEq/L    Potassium 5.1 3.5 - 5.1 mEq/L    Chloride 106 100 - 111 mEq/L    CO2 25 22 - 29 mEq/L    Calcium 9.6 8.5 - 10.5 mg/dL    Protein, Total 7.1 6.0 - 8.3 g/dL    Albumin 4.3 3.5 - 5.0 g/dL    AST (SGOT) 29 5 - 34 U/L    ALT 34 0 - 55 U/L    Alkaline Phosphatase 59 38 - 106 U/L    Bilirubin, Total 0.4 0.2 - 1.2 mg/dL    Globulin 2.8 2.0 - 3.6 g/dL    Albumin/Globulin Ratio 1.5 0.9 - 2.2    Anion Gap 11.0 5 - 15   Ethanol (Alcohol) Level   Result Value Ref Range    Alcohol 160 (H) None Detected mg/dL   UA Reflex to  Micro - Reflex to Culture    Specimen: Urine, Clean Catch   Result Value Ref Range    Urine Type Urine, Clean Ca     Color, UA Straw Colorless - Yellow    Clarity, UA Clear Clear - Hazy    Specific Gravity UA 1.005 1.001 - 1.035    Urine pH 6.0 5.0 - 8.0    Leukocyte Esterase, UA Negative Negative    Nitrite, UA Negative Negative    Protein, UR Negative Negative    Glucose, UA Negative Negative    Ketones UA Negative Negative    Urobilinogen, UA Negative 0.2 - 2.0 mg/dL    Bilirubin, UA Negative Negative    Blood, UA Negative Negative   Urine Tox Screen (Rapid Drug Screen)   Result Value Ref Range    Urine Amphetamine Screen Negative Negative    Barbiturate Screen, UR Negative Negative    Benzodiazepine Screen, UR Negative Negative     Cannabinoid Screen, UR Negative Negative    Cocaine, UR Negative Negative    Opiate Screen, UR Negative Negative    PCP Screen, UR Negative Negative   GFR   Result Value Ref Range    EGFR 56.5      Results for orders placed or performed during the hospital encounter of 05/11/20   CT Head without Contrast    Narrative    INDICATION: Head trauma.    TECHNIQUE: Multiple transaxial images of the head were obtained. No  intravenous contrast was administered. A combination of automatic  exposure control, adjustment of the mA and or KV according to patient  size, and/or use of iterative reconstruction technique was utilized.    FINDINGS: No intracranial mass lesion, hemorrhage, or territorial  infarction is demonstrated. Ventricular system and subarachnoid spaces  are within normal limits. No regions of abnormal density are noted  within the brain parenchyma. The visualized portions of the sinuses and  mastoid air cells are clear.      Impression     Unremarkable noncontrast CT of the brain.    Merri Ray, MD   05/11/2020 6:25 PM   XR Hip right 2-3 vw with pelvis    Narrative    Clinical History:    fall with pain    Technique:    XR HIP RIGHT 2-3 VW WITH PELVIS    Comparison:    None    Findings:    The soft tissues are within normal limits. The bone mineralization is  within normal limits. There are degenerative changes of the lower lumbar  spine, with osteophytosis, disc space narrowing and sclerotic endplate  degenerative changes. The bony pelvis is intact. There is mild arthrosis  of the sacroiliac joints and symphysis pubis. There is moderate  arthrosis of the hip joints. The proximal femur is intact bilaterally.      Impression    No acute osseous abnormality.    Degenerative changes as described above.    Alric Seton MD, MD   05/11/2020 7:05 PM         Ddx:  Concussion, alcohol, ICH  MDM  Ct head to ro ICH due to slurring speech and ETOH on board.  NO bleed  X-ray of right hip no fx    Will Bowie to fu PCP back  home             Procedures    Clinical Impression & Disposition     Clinical Impression  Final diagnoses:   Concussion  without loss of consciousness, initial encounter   Contusion of right hip, initial encounter   Alcohol abuse        ED Disposition     ED Disposition Condition Date/Time Comment    Discharge  Fri May 11, 2020  7:40 PM Dina Rich discharge to home/self care.    Condition at disposition: Stable           There are no discharge medications for this patient.                Bradly Chris, MD  05/12/20 [phone]

## 2020-05-11 NOTE — ED Triage Notes (Signed)
Pt was out golfing today when he jumped on the back of a golf cart and as they took a turn pt fell off. Pt denies any LOC and tried to go back to playing golf. Pt states he then started to feel dizzy and blurred vision and fell over while swinging his golf club. Pt did have ETOH this AM but states it was a while ago and this isnt from that.

## 2020-05-11 NOTE — Discharge Instructions (Signed)
Concussion    You have been diagnosed with a concussion.    A concussion is a type of injury to the head that causes a minor injury to the brain. Concussions can cause symptoms ranging from brief confusion to a true loss of consciousness (being knocked out). If a CT (CAT) scan were to be done, it would not show any serious injury such as any major bruising or bleeding in the brain.    Symptoms after a concussion can last from hours to months depending on how bad the injury was, and whether or not you had suffered from concussions in the past. Some of the problems they may have include difficulty with sleep, memory and concentration or attention (easy distractibility). Also, they may have chronic headaches and sensitivity to light. These symptoms can happen soon after the concussion or develop more slowly over time. They can last up to a year. When this happens, it is called "post concussive syndrome."    If you develop "post-concussive syndrome," you should follow up with your doctor. Your doctor can care for you or provide a referral to a head-injury specialist.    YOU SHOULD SEEK MEDICAL ATTENTION IMMEDIATELY, EITHER HERE OR AT THE NEAREST EMERGENCY DEPARTMENT, IF ANY OF THE FOLLOWING OCCURS:   Your headache gets worse.   Your headache pain changes.   You have a fever (temperature higher than 100.4F / 38C).   You feel numbness, tingling, weakness in your arms or legs.   You faint.   Your vision changes.   You vomit often or cannot keep medication down.   You are confused or have difficulty waking from sleep.               Hip Contusion    You were diagnosed with a hip contusion.     A hip contusion is a large bruise on your hip. It can involve the deeper muscles as well as the bones of the hip. A bruise forms when blood vessels are injured and break open. Blood then leaks inside, but not through, the skin. You might notice a bruise when you fall or when something heavy hits you. A similar thing  happens when your hip is bruised. Most of the time, a hip contusion is caused by an injury. This injury might also cause some of your muscles to get crushed.    There might also be a lot of pain and swelling. Walking might be painful. Stretching your hip might be very painful. You might also feel some spasming pain. This is caused by damage to your muscles. Most of the time, a hip contusion will not show up on an x-ray. There are medical tests that can check whether or not you have a contusion. Most often, however, you will not need them. It is rare for a lot of blood to flow into your bruise, but it does happen. If a lot of blood collects in your muscles or bones, it is called a hematoma.    Most of the time, you will not need surgery. You might need surgery, though, if you are in a lot of pain. You might also need surgery if the build-up of blood creates too much pressure on your hip. You will probably be treated by an orthopedist (bone doctor) or a sports medicine doctor. Treatment usually involves resting your hip and taking pain medicine. You might also need to ice and compress your hip. It may take days to weeks for the contusion to   get completely better.     After the first 24 to 48 hours, keep your hip stretched. You can use ice to ease the pain.   ICE: By applying ice to the affected area, swelling and pain can be reduced. Place some ice cubes in a re-sealable (Ziploc) bag and add some water. Put a thin washcloth between the bag and the skin. Apply the ice bag to the area for at least 20 minutes. Do this at least 4 times per day. Using the ice for longer times and more often is okay. NEVER APPLY ICE DIRECTLY TO THE SKIN.    Your doctor might give you pain medicine, including anti-inflammatories (such as ibuprofen). You might also get muscle relaxants. Take them as your doctor tells you to. Do not drive or operate heavy machinery when you are taking muscle relaxants or prescription pain  medicines.    After a week, follow up with your primary care doctor, sports medicine doctor, or orthopedist. The doctor will check how well your contusion is healing.     Though we don't believe your condition is serious right now, it is important to be careful. Sometimes a problem that seems mild can become serious later. This is why it is very important that you return here or go to the nearest Emergency Department if you are not improving or your symptoms are getting worse.    YOU SHOULD SEEK MEDICAL ATTENTION IMMEDIATELY, EITHER HERE OR AT THE NEAREST EMERGENCY DEPARTMENT, IF ANY OF THE FOLLOWING OCCUR:     The swelling gets worse. The pain is so bad that you can't walk.   You feel numb in that area.   You get a fever (temperature higher than 100.4F or 38C) or chills.    If you can't follow up with your doctor, or if at any time you feel you need to be rechecked or seen again, come back here or go to the nearest emergency department.

## 2020-12-12 ENCOUNTER — Encounter: Payer: Self-pay | Admitting: Neurology

## 2020-12-17 ENCOUNTER — Ambulatory Visit: Payer: Commercial Managed Care - PPO | Admitting: Neurology

## 2020-12-17 ENCOUNTER — Encounter: Payer: Self-pay | Admitting: Neurology

## 2020-12-17 ENCOUNTER — Other Ambulatory Visit: Payer: Self-pay

## 2020-12-17 VITALS — BP 144/84 | Ht 72.0 in | Wt 277.0 lb

## 2020-12-17 DIAGNOSIS — Z9989 Dependence on other enabling machines and devices: Secondary | ICD-10-CM | POA: Diagnosis not present

## 2020-12-17 DIAGNOSIS — G4733 Obstructive sleep apnea (adult) (pediatric): Secondary | ICD-10-CM | POA: Diagnosis not present

## 2020-12-17 NOTE — Progress Notes (Signed)
Subjective:    Patient ID: Barry Holmes is a 60 y.o. male.  HPI     Interim history:   Barry Holmes is a 60 year old right-handed gentleman with an underlying medical history of lumbar spinal stenosis, bilateral knee pain with s/p right knee arthroscopic surgery in 2016, recent smoking cessation, elevated blood pressure values, and obesity, who presents for follow-up consultation of his obstructive sleep apnea, for his yearly checkup.  The patient is unaccompanied today. I last saw him on 12/08/19, at which time he was compliant with treatment, had some difficulty staying asleep.  He had some issues with dry eyes.  He was advised to proceed with an overnight pulse oximetry test to make sure his oxygen saturations were adequate while he was on AutoPap therapy.   I reviewed in the interim patient's pulse oximetry test results from 12/21/2019. Total test time was 7 hours and 59 minutes, test was started with AutoPap and placed on room air. Average oxygen saturation 93.3%, lowest saturation 85%, time below are at 88% saturation was 1.4 minutes.  We called him with his results.  Today, 12/17/2020: I reviewed I reviewed his AutoPap compliance data from 11/13/2020 through 12/12/2020, which is a total of 30 days, during which time he used his machine 27 days with percent used days greater than 4 hours at 90%, indicating excellent compliance with an average usage for days on treatment of 7 hours and 14 minutes, residual AHI at goal at 1.2/h, leak acceptable with a 95th percentile at 14.1 L/min, 95th percentile of pressure at 12.7 cm with a range of 5 to 15 cm with EPR of 2.  He reports that he gets his supplies [facility] and off of [address].  He did try to get transferred through the [place] branch of [facility] to the local branch of [facility] but did not get any supplies and eventually stopped asking [facility] for supplies.  He reports that he paid an outstanding bill but then also got a refund check for some money back.  He  is motivated to continue with treatment.  He does not always feel a big difference in terms of his sleep quality but is willing to maintain treatment.  He is on a blood pressure medication now, and also uses Ambien generic 5 mg strength as needed, during the workweek it is nearly nightly.  The patient's allergies, current medications, family history, past medical history, past social history, past surgical history and problem list were reviewed and updated as appropriate.      Previously:   I first met him at the request of his primary care physician on 09/28/2018, at which time the patient was on AutoPap therapy.  He had been diagnosed with severe obstructive sleep apnea with a sleep study in [place], [place].  He was compliant with treatment.  He was advised to follow-up routinely in 1 year.  He was advised to transfer his DME to a local office with [facility].    I reviewed his AutoPap compliance data from 11/08/2019 through 12/07/2019, which is a total of 30 days, during which time he used his machine every night with percent use days greater than 4 hours at 100%, indicating superb compliance with an average usage of 7 hours and 25 minutes, residual AHI at goal at 1.3/h, leak acceptable but on the higher end with a 95th percentile at 20.2 L/min, average pressure for the 95th percentile at 11.3 L/min, pressure range of 5 to 15 cm with EPR of 2.  09/28/18: (He) was recently diagnosed with obstructive sleep apnea and placed on CPAP therapy. I reviewed your office note from 09/15/2018, which you kindly included. He had sleep study testing in [place], [place]. He had a split-night sleep study on 02/23/2018 and I reviewed the report. Total AHI was 62.9 per hour during the baseline portion of the study. O2 nadir was 87%. He did not achieve any REM sleep during the baseline part of the study. He was titrated on CPAP from 5 cm to 11 cm. I reviewed his PAP compliance data from 08/29/2018 through 09/27/2018,  which is a total of 30 days, during which time he used his machine 28 days with percent used days greater than 4 hours at 93%, indicating excellent compliance with an average usage of 7 hours and 40 minutes, residual AHI at goal at 2.1 per hour, leak acceptable with the 95th percentile at 13.3 L/m, 95th percentile pressure at 11.6 cm, he is on AutoPap with a minimum pressure of 5 cm, max pressure of 15 cm, with EPR. He reports that AutoPap is going well. His DME is [facility].  His Epworth sleepiness score is 5 out of 24 today, fatigue score is 13 out of 63. He is married and lives with his wife, they have no children. He quit smoking in September 2019, drinks alcohol, up to 10 drinks per week on average, caffeine the form of coffee, 2-3 cups per day on average.  No pets in the house. Does not watch TV in the [place]. He takes OTC Advil or Tylenol for arthritis.  He reports being fully compliant with his AutoPap. He has greatly benefited from it and since starting treatment about 6 months ago he has slept better, sleep is more consolidated, less nocturia is noted, he wakes up better rested and has less daytime somnolence. He denies any restless leg symptoms. Bedtime is generally around 9:30 and he likes to read until about 10. Rise time is around 5 or 5:30. He is not aware of any family history of OSA. He had a tonsillectomy as a child. He has gained weight in the past few months, especially since the move from [place] about 4 months ago. He is hoping to buy a home soon, they were recently able to close on their home in [place]. He works for [employer].  His Past Medical History Is Significant For: Past Medical History:  Diagnosis Date  . Bilateral knee pain   . HSV-1 (herpes simplex virus 1) infection   . OSA on CPAP   . Spinal stenosis     His Past Surgical History Is Significant For: Past Surgical History:  Procedure Laterality Date  . INGUINAL HERNIA REPAIR    . TONSILECTOMY, ADENOIDECTOMY, BILATERAL  MYRINGOTOMY AND TUBES      His Family History Is Significant For: Family History  Problem Relation Age of Onset  . Arthritis Mother   . Hypertension Mother   . Hyperlipidemia Mother   . CAD Father     His Social History Is Significant For: Social History   Socioeconomic History  . Marital status: Married    Spouse name: Not on file  . Number of children: Not on file  . Years of education: Not on file  . Highest education level: Not on file  Occupational History  . Not on file  Tobacco Use  . Smoking status: Former Research scientist (life sciences)  . Smokeless tobacco: Never Used  Substance and Sexual Activity  . Alcohol use: Yes    Alcohol/week:  10.0 standard drinks    Types: 10 Standard drinks or equivalent per week  . Drug use: Not on file  . Sexual activity: Not on file  Other Topics Concern  . Not on file  Social History Narrative  . Not on file   Social Determinants of Health   Financial Resource Strain: Not on file  Food Insecurity: Not on file  Transportation Needs: Not on file  Physical Activity: Not on file  Stress: Not on file  Social Connections: Not on file    His Allergies Are:  Allergies  Allergen Reactions  . Shellfish Allergy   :   His Current Medications Are:  Outpatient Encounter Medications as of 12/17/2020  Medication Sig  . acyclovir ointment (ZOVIRAX) 5 % Apply 1 application topically.  Marland Kitchen olmesartan (BENICAR) 20 MG tablet Take 20 mg by mouth daily.  . valACYclovir (VALTREX) 1000 MG tablet Take 1,000 mg by mouth.  . zolpidem (AMBIEN) 5 MG tablet Take 5 mg by mouth at bedtime as needed.   No facility-administered encounter medications on file as of 12/17/2020.  :  Review of Systems:  Out of a complete 14 point review of systems, all are reviewed and negative with the exception of these symptoms as listed below: Review of Systems  Neurological:       Here for yearly f/u. Reports he still is not seeing great benefit from using his CPAP. Reports dry mouth is a  frequent problem for him.    Objective:  Neurological Exam  Physical Exam Physical Examination:   Vitals:   12/17/20 1033  BP: (!) 144/84    General Examination: The patient is a very pleasant 60 y.o. male in no acute distress. He appears well-developed and well-nourished and well groomed.   HEENT:Normocephalic, atraumatic, pupils are equal, round and reactive to light. Extraocular tracking is good without limitation to gaze excursion or nystagmus noted.  Corrective eyeglasses in place, hearing grossly intact.  Face is symmetric with normal facial animation. Speech is clear with no dysarthria noted. There is no hypophonia. There is no lip, neck/head, jaw or voice tremor. Neck is supple with full range of passive and active motion. There are no carotid bruits on auscultation. Oropharynx exam reveals: mildmouth dryness, adequatedental hygiene and moderateairway crowding.Tongue protrudes centrally and palate elevates symmetrically.   Chest:Clear to auscultation without wheezing, rhonchi or crackles noted.  Heart:S1+S2+0, regular and normal without murmurs, rubs or gallops noted.   Abdomen:Soft, non-tender and non-distended.  Extremities:There isnopitting edema in the distal lower extremities bilaterally.  Skin: Warm and dry without trophic changes noted.  Musculoskeletal: exam reveals no obvious joint deformities, tenderness or joint swelling or erythema, except b/l knee discomfort, stable.   Neurologically:  Mental status: The patient is awake, alert and oriented in all 4 spheres.Hisimmediate and remote memory, attention, language skills and fund of knowledge are appropriate. There is no evidence of aphasia, agnosia, apraxia or anomia. Speech is clear with normal prosody and enunciation. Thought process is linear. Mood is normaland affect is normal.  Cranial nerves II - XII are as described above under HEENT exam.  Motor exam: Normal bulk, strength and tone is  noted. There is no tremor. Fine motor skills and coordination:grosslyintact.  Cerebellar testing: No dysmetria or intention tremor.  Sensory exam: intact to light touch in the upper and lower extremities.  Gait, station and balance:Hestands easily. No veering to one side is noted. No leaning to one side is noted. Posture is age-appropriate and stance is narrow  based. Gait showsnormalstride length and normalpace. No problems turning are noted.   Assessmentand Plan:  In summary,Barry Jonesis a very pleasant 11 year oldmalewith an underlying medical history of lumbar spinal stenosis, bilateral knee pain with s/p right knee arthroscopic surgery in 2016 (reports needing bilateral knee replacements), prior smoking, elevated blood pressure values, and obesity, whopresents for follow-up consultation of his obstructive sleep apnea.  He has been on AutoPap therapy since September 2019.  He has ongoing excellent compliance.  He has not noticed any significant change in how he feels.  Numbers look good, he does not have significant leak from the nasal mask.  Initially he noticed significant improvement in his sleep symptoms.  He has been on Ambien generic 5 mg strength as needed per primary care.  He uses this generally during the workweek.  He is advised to continue with full compliance of his AutoPap machine. He was diagnosed with severe obstructive sleep apnea in [place] in July or August 2019 and had a split-night sleep study.  He had trouble getting his supplies through [facility].  He is now getting [facility]  We did an overnight pulse oximetry test while he was on AutoPap, results did not support any significant and sustained desaturations, no need for supplemental oxygen.  He is commended on his treatment adherence.  He is motivated to continue with AutoPap therapy.  He is advised to continue with his treatment on the current settings and follow-up routinely in 1 year  to see one of our nurse practitioners.  I answered all his questions today and he was in agreement. I spent 22 minutes in total face-to-face time and in reviewing records during pre-charting, more than 50% of which was spent in counseling and coordination of care, reviewing test results, reviewing medications and treatment regimen and/or in discussing or reviewing the diagnosis of OSA, the prognosis and treatment options. Pertinent laboratory and imaging test results that were available during this visit with the patient were reviewed by me and considered in my medical decision making (see chart for details).

## 2020-12-17 NOTE — Patient Instructions (Signed)
Verbal instructions given: Continue AutoPap at the current settings, follow-up in 1 year to see one of our nurse practitioners.

## 2021-04-04 ENCOUNTER — Other Ambulatory Visit: Payer: Self-pay | Admitting: Internal Medicine

## 2021-04-04 DIAGNOSIS — F17219 Nicotine dependence, cigarettes, with unspecified nicotine-induced disorders: Secondary | ICD-10-CM

## 2021-04-05 ENCOUNTER — Other Ambulatory Visit: Payer: Self-pay | Admitting: Internal Medicine

## 2021-04-05 DIAGNOSIS — E041 Nontoxic single thyroid nodule: Secondary | ICD-10-CM

## 2021-04-15 ENCOUNTER — Ambulatory Visit
Admission: RE | Admit: 2021-04-15 | Discharge: 2021-04-15 | Disposition: A | Discharge status: Home / Self Care | Payer: Commercial Managed Care - PPO | Source: Ambulatory Visit | Attending: Internal Medicine | Admitting: Internal Medicine

## 2021-04-15 DIAGNOSIS — F17219 Nicotine dependence, cigarettes, with unspecified nicotine-induced disorders: Secondary | ICD-10-CM

## 2021-04-15 DIAGNOSIS — E041 Nontoxic single thyroid nodule: Secondary | ICD-10-CM

## 2021-04-29 ENCOUNTER — Ambulatory Visit: Payer: Commercial Managed Care - PPO

## 2021-11-08 ENCOUNTER — Encounter: Payer: Self-pay | Admitting: Neurology

## 2021-11-08 DIAGNOSIS — G4719 Other hypersomnia: Secondary | ICD-10-CM

## 2021-11-08 DIAGNOSIS — G4733 Obstructive sleep apnea (adult) (pediatric): Secondary | ICD-10-CM

## 2021-11-11 NOTE — Telephone Encounter (Signed)
Pt has appt 12-18-2021 scheduled annually) with MM/NP.  (last set up was 04-14-2018).  I can see if can see sooner another NP, as has not seen Megan initially.  ?

## 2021-11-12 NOTE — Telephone Encounter (Signed)
Coltrane, Phylliss Blakes, RN; [place], Ashly; [place], [place] ?PRINTED  ? ?THANKS   ? ?  ?Previous Messages ?  ?----- Message -----  ?From: Brandon Melnick, RN  ?Sent: 11/11/2021  10:25 AM EDT  ?To: Lourena Simmonds, *  ?Subject: new resmed machine  (time sensitive)          ? ?Good morning ,  ? ?This pt had machine that has failed.  We have new order in Epic for him with time sensitive if can make work.  He has appt 12-18-2021 with [place], if can get new machine for him by 11-17-2021 resmed we can keep that appt for him.  30 day compliance.  ? ?I relayed this to pt, but if was not workable may have to delay follow up appt.  FYI  ? ?Thank you  Lovey Newcomer RN  ? ?Saamir Ronnald Ramp "Keenan Bachelor"  ?Male, 61 y.o., 11-Sep-1960  ?MRN:  [id number]  ?Phone:  [phone]  ? ?

## 2021-11-20 ENCOUNTER — Encounter: Payer: Self-pay | Admitting: Neurology

## 2021-11-21 ENCOUNTER — Telehealth: Payer: Self-pay | Admitting: Neurology

## 2021-11-21 NOTE — Telephone Encounter (Signed)
FYI to Puckett, RN and Jinny Blossom, NP ?The reason as to why the 05-09 appointment was made is due to message pt received on CPAP that the life expectancy is exceeded.  Pt was told by [facility] that he needed an appointment within 90 days in order to processes his request for a new CPAP.   ?

## 2021-11-21 NOTE — Telephone Encounter (Signed)
..   Pt understands that although there may be some limitations with this type of visit, we will take all precautions to reduce any security or privacy concerns.  Pt understands that this will be treated like an in office visit and we will file with pt's insurance, and there may be a patient responsible charge related to this service. ? ?

## 2021-11-25 ENCOUNTER — Encounter: Payer: Self-pay | Admitting: Neurology

## 2021-11-25 NOTE — Telephone Encounter (Signed)
Pt wanted script for his cpap to buy one outright since [facility] would not allow (sent script to him via email).  Noted that was sent via email that was scanned in.   ?

## 2021-12-06 DIAGNOSIS — G4733 Obstructive sleep apnea (adult) (pediatric): Secondary | ICD-10-CM | POA: Diagnosis not present

## 2021-12-06 DIAGNOSIS — R7301 Impaired fasting glucose: Secondary | ICD-10-CM | POA: Diagnosis not present

## 2021-12-10 ENCOUNTER — Telehealth (INDEPENDENT_AMBULATORY_CARE_PROVIDER_SITE_OTHER): Payer: Self-pay | Admitting: Adult Health

## 2021-12-10 DIAGNOSIS — Z9989 Dependence on other enabling machines and devices: Secondary | ICD-10-CM

## 2021-12-10 DIAGNOSIS — G4733 Obstructive sleep apnea (adult) (pediatric): Secondary | ICD-10-CM

## 2021-12-10 NOTE — Progress Notes (Signed)
?  [facility] ?[address] ?[place]. [place] [address] ?[phone] ? ?PRIMARY NEUROLOGIST: Dr. Rexene Alberts ? ? ?Virtual Visit via Telephone Note ? ?I connected with Barry Holmes on 12/10/21 at  2:30 PM EDT by telephone located remotely at [facility] and verified that I am speaking with the correct person using two identifiers who reports being located at home ?  ?Visit scheduled by Me. She discussed the limitations, risks, security and privacy concerns of performing an evaluation and management service by telephone and the availability of in person appointments. I also discussed with the patient that there may be a patient responsible charge related to this service. The patient expressed understanding and agreed to proceed. See telephone note for consent and additional scheduling information.  ? ? ?History of Present Illness: ? ?Barry Holmes is a 61 y.o. male who has been followed in this office for OSA on CPAP. Was able to connect audio but not video. This DL is from old machine not his new machine. Reports that he is a mouth breather but has the nasal cushions. Initially he slept with his mouth closed but in the last year started opening his mouth. Has ordered a full face mask. Reports that he can't sleep without it.  ? ? ? ?  ?Observations/Objective: ? ?Generalized: Well developed, in no acute distress  ? ?Neurological examination  ?Mentation: Alert oriented to time, place, history taking. Follows all commands speech and language fluent ? ?Assessment and Plan: ? ?1:  OSA on CPAP ? ?- good Compliance  ?- good treatment of AHI ?- use nightly and >4 hours each night ? ? ? ?Follow Up Instructions: ? ? F/U in 1 year ? ? ? ?I discussed the assessment and treatment plan with the patient.  The patient was provided an opportunity to ask questions and all were answered to their satisfaction. The patient agreed with the plan and verbalized an understanding of the instructions. ?  ?I  provided 8 minutes of non-face-to-face time during this encounter. ? ? ? ?Ward Givens NP-C ? ?[facility] ?[place], [place] [address] ? ?Phone [phone] Fax [phone] ?\ ? ? ? ? ?

## 2021-12-18 ENCOUNTER — Ambulatory Visit: Payer: Commercial Managed Care - PPO | Admitting: Adult Health

## 2022-01-23 ENCOUNTER — Ambulatory Visit: Payer: Commercial Managed Care - PPO | Admitting: Neurology

## 2022-03-19 ENCOUNTER — Other Ambulatory Visit (HOSPITAL_BASED_OUTPATIENT_CLINIC_OR_DEPARTMENT_OTHER): Payer: Self-pay

## 2022-03-20 ENCOUNTER — Other Ambulatory Visit (HOSPITAL_BASED_OUTPATIENT_CLINIC_OR_DEPARTMENT_OTHER): Payer: Self-pay

## 2022-03-20 MED ORDER — WEGOVY 0.5 MG/0.5ML ~~LOC~~ SOAJ
SUBCUTANEOUS | 0 refills | Status: AC
  Filled 2022-04-03: qty 2, 28d supply, fill #0

## 2022-03-20 MED ORDER — WEGOVY 0.25 MG/0.5ML ~~LOC~~ SOAJ
SUBCUTANEOUS | 0 refills | Status: DC
  Filled 2022-03-20: qty 2, 28d supply, fill #0

## 2022-04-03 ENCOUNTER — Other Ambulatory Visit (HOSPITAL_BASED_OUTPATIENT_CLINIC_OR_DEPARTMENT_OTHER): Payer: Self-pay

## 2022-04-08 ENCOUNTER — Encounter (HOSPITAL_BASED_OUTPATIENT_CLINIC_OR_DEPARTMENT_OTHER): Payer: Self-pay

## 2022-04-08 ENCOUNTER — Other Ambulatory Visit (HOSPITAL_BASED_OUTPATIENT_CLINIC_OR_DEPARTMENT_OTHER): Payer: Self-pay

## 2022-04-10 ENCOUNTER — Other Ambulatory Visit (HOSPITAL_BASED_OUTPATIENT_CLINIC_OR_DEPARTMENT_OTHER): Payer: Self-pay

## 2022-04-14 ENCOUNTER — Other Ambulatory Visit: Payer: Self-pay | Admitting: Internal Medicine

## 2022-04-14 ENCOUNTER — Other Ambulatory Visit (HOSPITAL_BASED_OUTPATIENT_CLINIC_OR_DEPARTMENT_OTHER): Payer: Self-pay

## 2022-04-14 DIAGNOSIS — F17219 Nicotine dependence, cigarettes, with unspecified nicotine-induced disorders: Secondary | ICD-10-CM

## 2022-04-21 ENCOUNTER — Other Ambulatory Visit (HOSPITAL_BASED_OUTPATIENT_CLINIC_OR_DEPARTMENT_OTHER): Payer: Self-pay

## 2022-04-23 ENCOUNTER — Other Ambulatory Visit (HOSPITAL_BASED_OUTPATIENT_CLINIC_OR_DEPARTMENT_OTHER): Payer: Self-pay

## 2022-04-29 ENCOUNTER — Other Ambulatory Visit (HOSPITAL_BASED_OUTPATIENT_CLINIC_OR_DEPARTMENT_OTHER): Payer: Self-pay

## 2022-05-05 ENCOUNTER — Ambulatory Visit
Admission: RE | Admit: 2022-05-05 | Discharge: 2022-05-05 | Disposition: A | Discharge status: Home / Self Care | Payer: BC Managed Care – PPO | Source: Ambulatory Visit | Attending: Internal Medicine | Admitting: Internal Medicine

## 2022-05-05 DIAGNOSIS — F17219 Nicotine dependence, cigarettes, with unspecified nicotine-induced disorders: Secondary | ICD-10-CM

## 2022-05-22 ENCOUNTER — Other Ambulatory Visit (HOSPITAL_BASED_OUTPATIENT_CLINIC_OR_DEPARTMENT_OTHER): Payer: Self-pay

## 2022-05-22 MED ORDER — WEGOVY 1 MG/0.5ML ~~LOC~~ SOAJ
1.0000 mg | SUBCUTANEOUS | 1 refills | Status: AC
  Filled 2022-05-22: qty 2, 28d supply, fill #0

## 2022-07-01 ENCOUNTER — Other Ambulatory Visit (HOSPITAL_BASED_OUTPATIENT_CLINIC_OR_DEPARTMENT_OTHER): Payer: Self-pay

## 2022-07-01 MED ORDER — WEGOVY 1.7 MG/0.75ML ~~LOC~~ SOAJ
1.7000 mg | SUBCUTANEOUS | 0 refills | Status: AC
  Filled 2022-07-01: qty 3, 28d supply, fill #0

## 2022-07-20 IMAGING — CT CT CHEST LUNG CANCER SCREENING LOW DOSE W/O CM
2 of 5 series · 15 of 40 positions shown, 18 images · non-contrast
Comparison: 03/24/2019 calcium score CT.

CLINICAL DATA: Thirty-five pack-year smoking history. Current
smoker.

EXAM:
CT CHEST WITHOUT CONTRAST LOW-DOSE FOR LUNG CANCER SCREENING
TECHNIQUE: Multidetector CT imaging of the chest was performed following the
standard protocol without IV contrast.

[Series 4: lung 1.00 br44 cor · coronal · 0.68mm/px · 3 of 452 slices shown]
[im 91/452  lung]
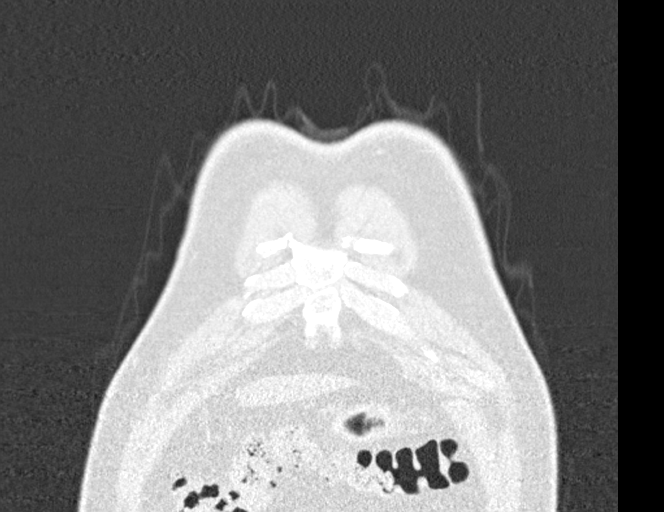
[im 181/452  lung]
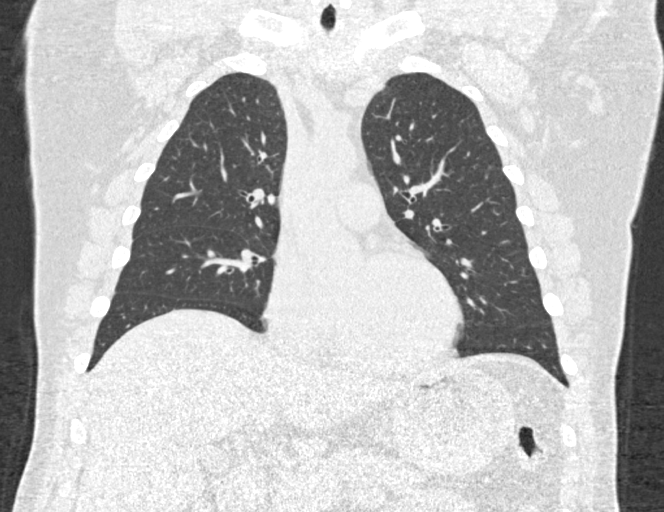
[im 271/452  lung]
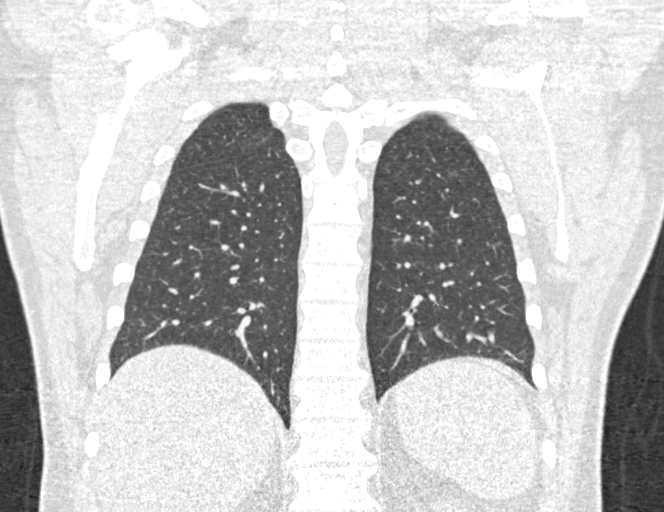

[Series 9: lung 1.00 br60 axial · axial · 0.88mm/px · z∈[-1292,-978]mm · 12 of 348 slices shown, 15 images]
[im 17/348  mediastinal]
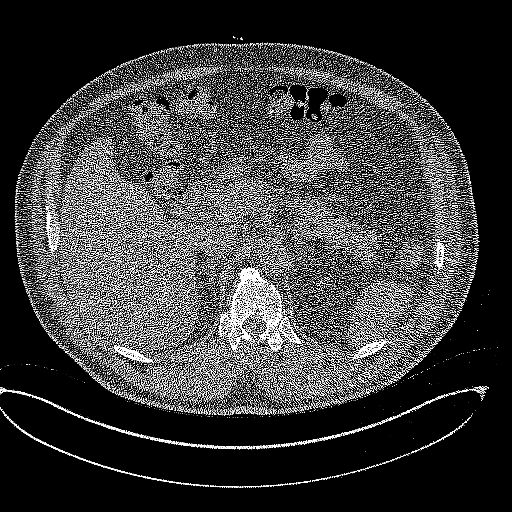
[im 17/348  lung]
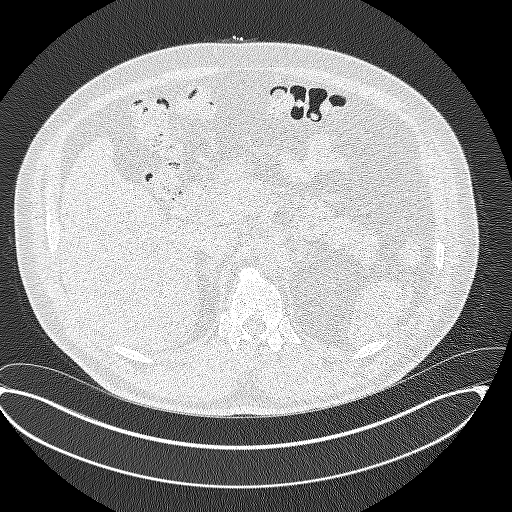
[im 50/348  lung]
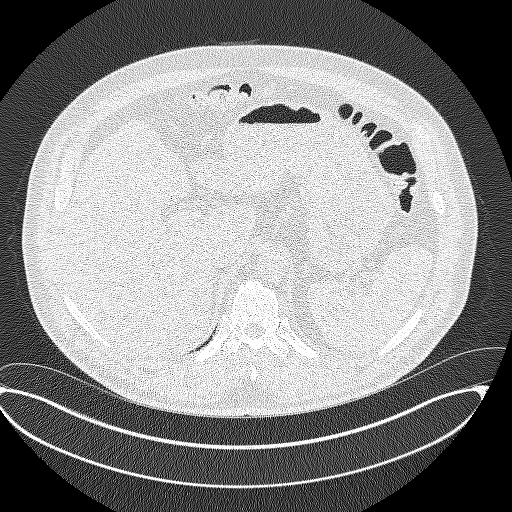
[im 83/348  lung]
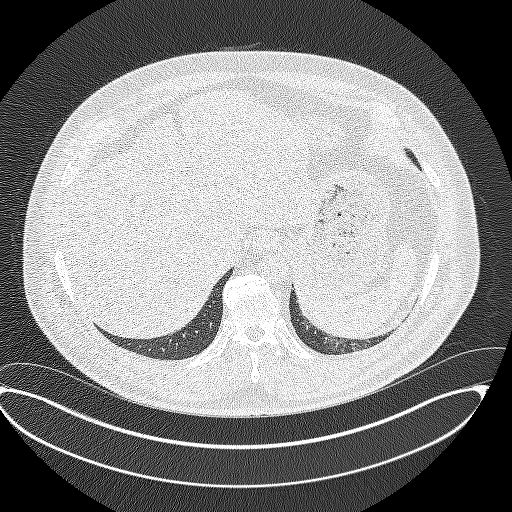
[im 100/348  lung]
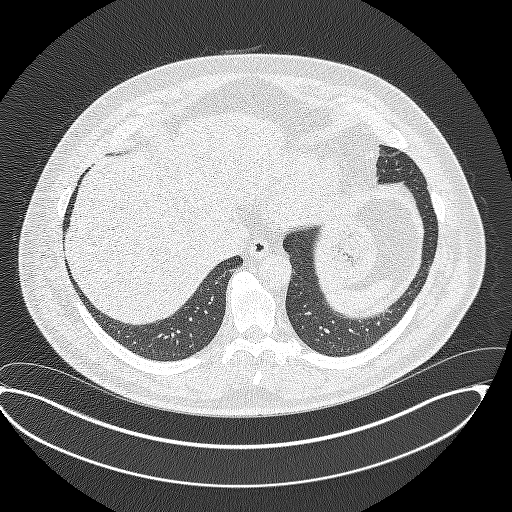
[im 133/348  mediastinal]
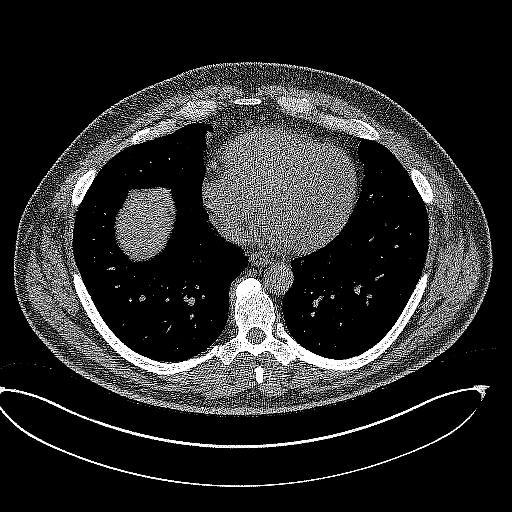
[im 133/348  lung]
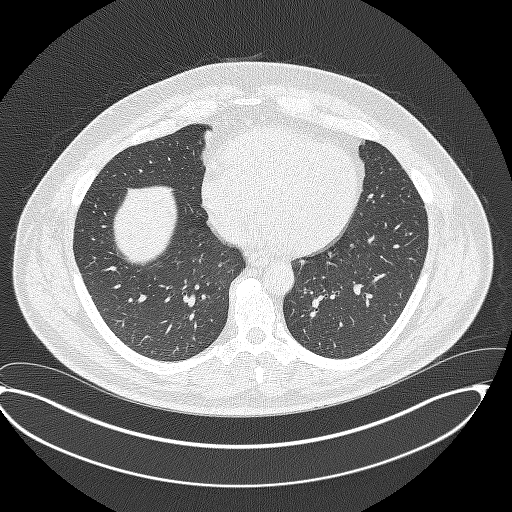
[im 166/348  lung]
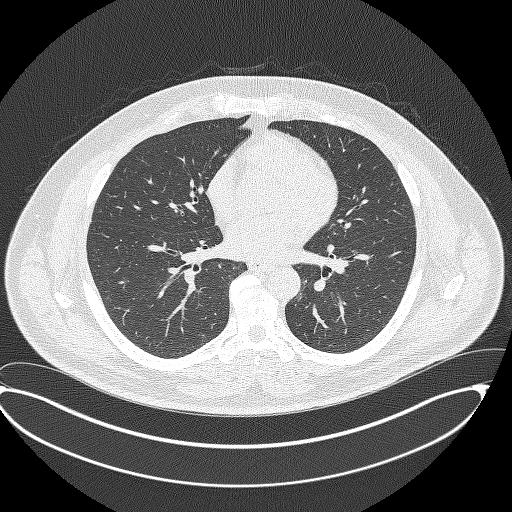
[im 182/348  lung]
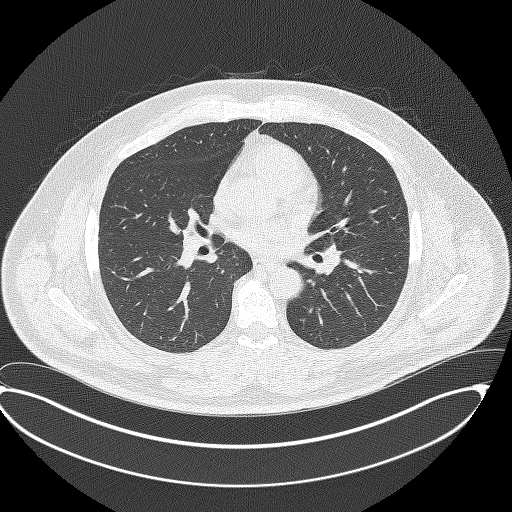
[im 215/348  lung]
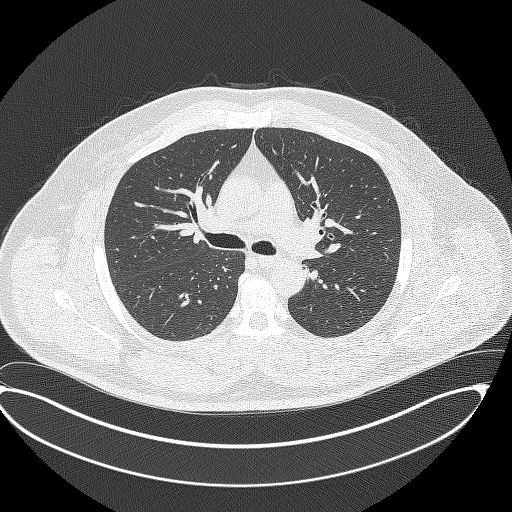
[im 248/348  mediastinal]
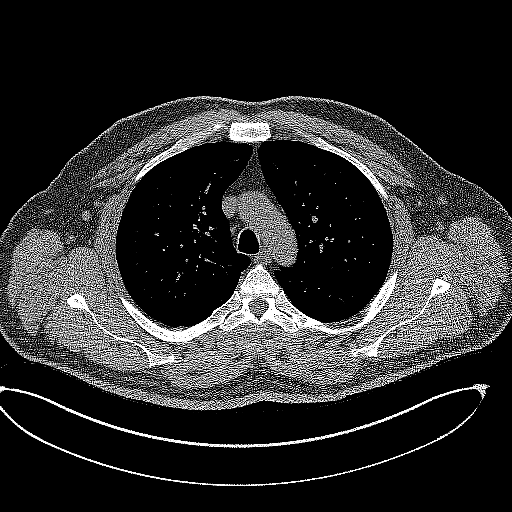
[im 248/348  lung]
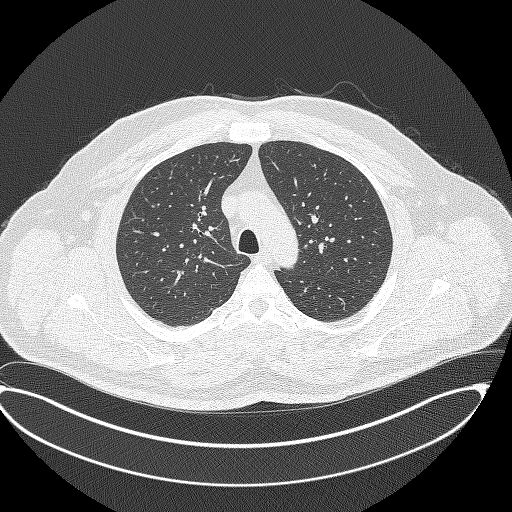
[im 265/348  lung]
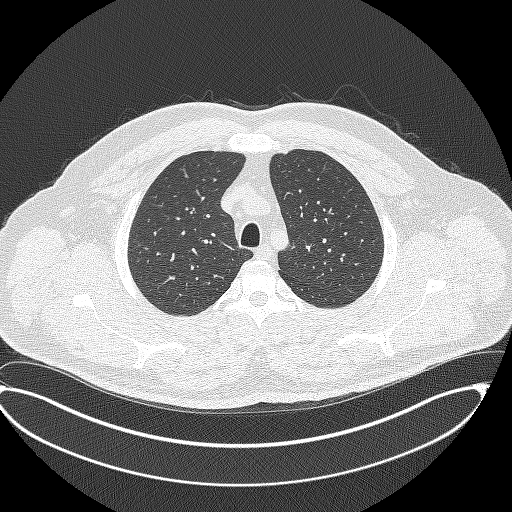
[im 298/348  lung]
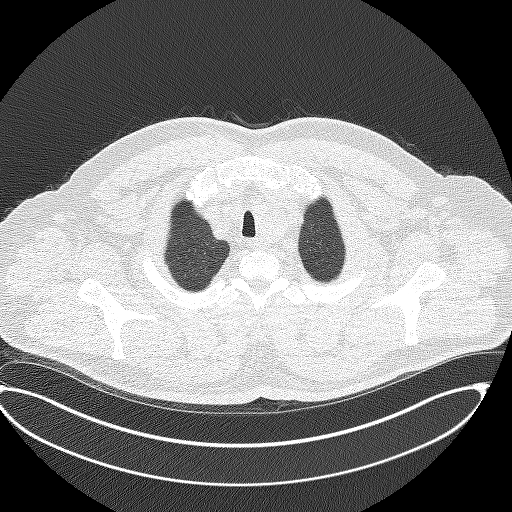
[im 331/348  lung]
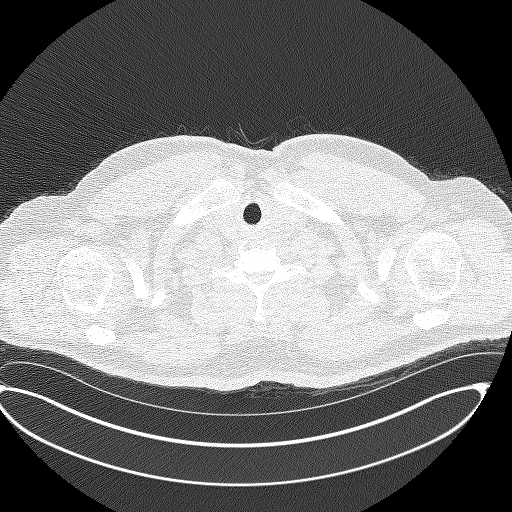

[15 of 40 positions shown; findings below may reference images not displayed]

FINDINGS: Cardiovascular: Bovine arch. Aortic atherosclerosis. Normal heart
size, without pericardial effusion.

Mediastinum/Nodes: Hypoattenuating left thyroid nodule of 2.3 cm on
[DATE]. No mediastinal or definite hilar adenopathy, given
limitations of unenhanced CT.

Lungs/Pleura: No pleural fluid. Superior segment left lower lobe
calcified granuloma. No suspicious pulmonary nodule or mass.

Upper Abdomen: Mild hepatic steatosis. Normal imaged portions of the
spleen, stomach, pancreas, gallbladder, adrenal glands, left kidney.

Musculoskeletal: Moderate midthoracic spondylosis.
IMPRESSION: 1. Lung-RADS 1, negative. Continue annual screening with low-dose
chest CT without contrast in 12 months.
2. Hepatic steatosis.
3. Aortic Atherosclerosis ([id number]).
4. Hypoattenuating left thyroid nodule of 2.3 cm. Recommend thyroid
US (ref: [HOSPITAL]. [DATE]): 143-50).

## 2022-07-21 ENCOUNTER — Other Ambulatory Visit (HOSPITAL_BASED_OUTPATIENT_CLINIC_OR_DEPARTMENT_OTHER): Payer: Self-pay

## 2022-07-21 MED ORDER — WEGOVY 2.4 MG/0.75ML ~~LOC~~ SOAJ
2.4000 mg | SUBCUTANEOUS | 6 refills | Status: AC
  Filled 2022-07-21: qty 3, 28d supply, fill #0
  Filled 2022-08-18: qty 3, 28d supply, fill #1
  Filled 2022-09-15 – 2022-09-22 (×4): qty 3, 28d supply, fill #2
  Filled 2022-10-10 – 2022-10-13 (×2): qty 3, 28d supply, fill #3

## 2022-09-15 ENCOUNTER — Other Ambulatory Visit (HOSPITAL_BASED_OUTPATIENT_CLINIC_OR_DEPARTMENT_OTHER): Payer: Self-pay

## 2022-09-16 ENCOUNTER — Other Ambulatory Visit (HOSPITAL_BASED_OUTPATIENT_CLINIC_OR_DEPARTMENT_OTHER): Payer: Self-pay

## 2022-09-17 ENCOUNTER — Other Ambulatory Visit (HOSPITAL_COMMUNITY): Payer: Self-pay

## 2022-09-17 ENCOUNTER — Other Ambulatory Visit (HOSPITAL_BASED_OUTPATIENT_CLINIC_OR_DEPARTMENT_OTHER): Payer: Self-pay

## 2022-09-19 ENCOUNTER — Other Ambulatory Visit (HOSPITAL_BASED_OUTPATIENT_CLINIC_OR_DEPARTMENT_OTHER): Payer: Self-pay

## 2022-09-22 ENCOUNTER — Other Ambulatory Visit (HOSPITAL_BASED_OUTPATIENT_CLINIC_OR_DEPARTMENT_OTHER): Payer: Self-pay

## 2022-10-10 ENCOUNTER — Other Ambulatory Visit (HOSPITAL_BASED_OUTPATIENT_CLINIC_OR_DEPARTMENT_OTHER): Payer: Self-pay

## 2022-10-13 ENCOUNTER — Other Ambulatory Visit (HOSPITAL_BASED_OUTPATIENT_CLINIC_OR_DEPARTMENT_OTHER): Payer: Self-pay

## 2023-01-22 ENCOUNTER — Other Ambulatory Visit: Payer: Self-pay | Admitting: Family Medicine

## 2023-01-22 DIAGNOSIS — R590 Localized enlarged lymph nodes: Secondary | ICD-10-CM

## 2023-02-02 ENCOUNTER — Ambulatory Visit
Admission: RE | Admit: 2023-02-02 | Discharge: 2023-02-02 | Disposition: A | Discharge status: Home / Self Care | Payer: 59 | Source: Ambulatory Visit | Attending: Family Medicine | Admitting: Family Medicine

## 2023-02-02 DIAGNOSIS — R590 Localized enlarged lymph nodes: Secondary | ICD-10-CM

## 2023-05-07 ENCOUNTER — Other Ambulatory Visit: Payer: Self-pay | Admitting: Internal Medicine

## 2023-05-07 DIAGNOSIS — F17219 Nicotine dependence, cigarettes, with unspecified nicotine-induced disorders: Secondary | ICD-10-CM

## 2023-05-22 ENCOUNTER — Ambulatory Visit
Admission: RE | Admit: 2023-05-22 | Discharge: 2023-05-22 | Disposition: A | Discharge status: Home / Self Care | Payer: 59 | Source: Ambulatory Visit | Attending: Internal Medicine | Admitting: Internal Medicine

## 2023-05-22 DIAGNOSIS — F17219 Nicotine dependence, cigarettes, with unspecified nicotine-induced disorders: Secondary | ICD-10-CM

## 2023-06-23 ENCOUNTER — Other Ambulatory Visit (HOSPITAL_BASED_OUTPATIENT_CLINIC_OR_DEPARTMENT_OTHER): Payer: Self-pay

## 2023-06-23 MED ORDER — WEGOVY 2.4 MG/0.75ML ~~LOC~~ SOAJ
2.4000 mg | SUBCUTANEOUS | 1 refills | Status: AC
  Filled 2023-06-23: qty 3, 28d supply, fill #0

## 2023-12-24 ENCOUNTER — Other Ambulatory Visit (HOSPITAL_BASED_OUTPATIENT_CLINIC_OR_DEPARTMENT_OTHER): Payer: Self-pay

## 2024-06-20 ENCOUNTER — Other Ambulatory Visit: Payer: Self-pay | Admitting: Internal Medicine

## 2024-06-20 DIAGNOSIS — F17219 Nicotine dependence, cigarettes, with unspecified nicotine-induced disorders: Secondary | ICD-10-CM

## 2024-07-05 ENCOUNTER — Inpatient Hospital Stay: Admission: RE | Admit: 2024-07-05 | Source: Ambulatory Visit

## 2024-07-05 ENCOUNTER — Inpatient Hospital Stay
Admission: RE | Admit: 2024-07-05 | Discharge: 2024-07-05 | Disposition: A | Source: Ambulatory Visit | Attending: Internal Medicine | Admitting: Internal Medicine

## 2024-07-05 DIAGNOSIS — F17219 Nicotine dependence, cigarettes, with unspecified nicotine-induced disorders: Secondary | ICD-10-CM
